# Patient Record
Sex: Female | Born: 2000 | Race: White | Hispanic: No | Marital: Single | Smoking: Never smoker
Health system: Southern US, Community
[De-identification: ages and names within clinical notes are randomized; demographics above are authoritative.]

---
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
## 2019-01-10 IMAGING — CT CT ABD-PELV W/ CM
2 of 4 series · 17 of 46 positions shown, 19 images · IV contrast (Omni 300)
Comparison: None.

CLINICAL DATA: 16-year-old female with right-sided abdominal pain.
Concern for appendicitis.

EXAM:
CT ABDOMEN AND PELVIS WITH CONTRAST
TECHNIQUE: Multidetector CT imaging of the abdomen and pelvis was performed
using the standard protocol following bolus administration of
intravenous contrast.
CONTRAST:  100mL OMNIPAQUE IOHEXOL 300 MG/ML  SOLN

[Series 3: a/p w/ 5mm · axial · 0.81mm/px · z∈[+677,+1057]mm · 14 of 84 slices shown, 16 images]
[im 4/84  soft-tissue]
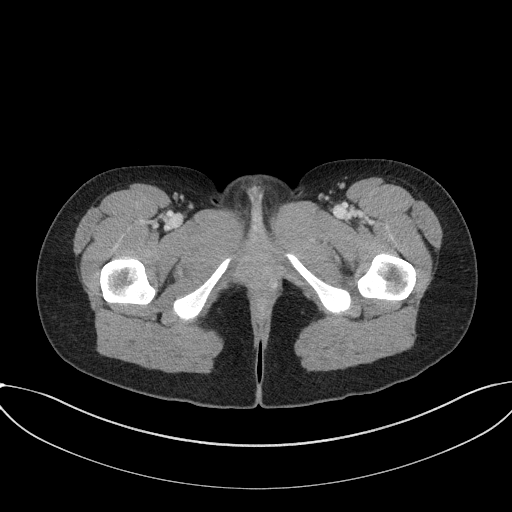
[im 4/84  bone]
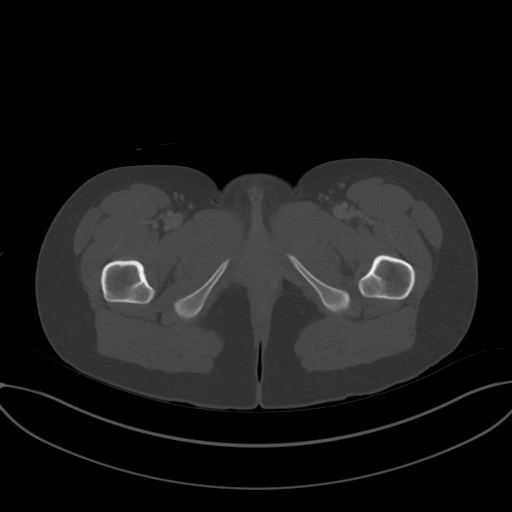
[im 11/84  soft-tissue]
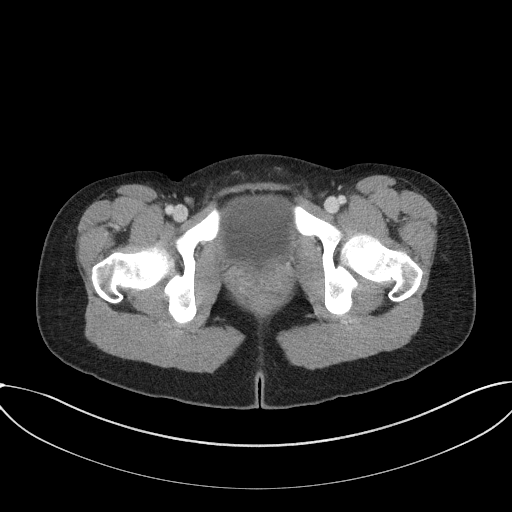
[im 18/84  soft-tissue]
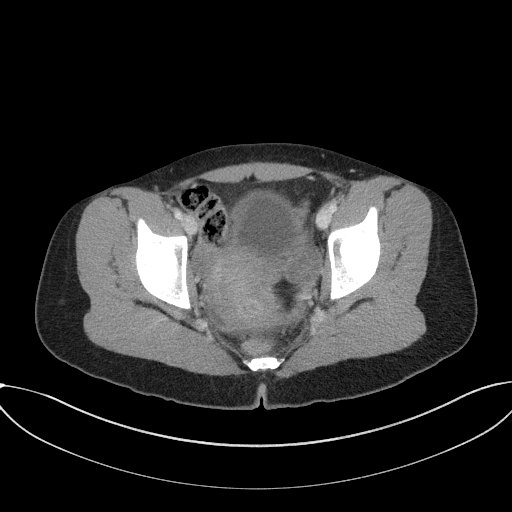
[im 21/84  soft-tissue]
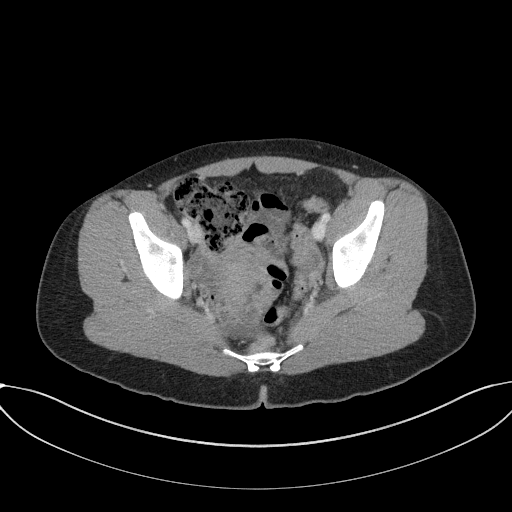
[im 28/84  soft-tissue]
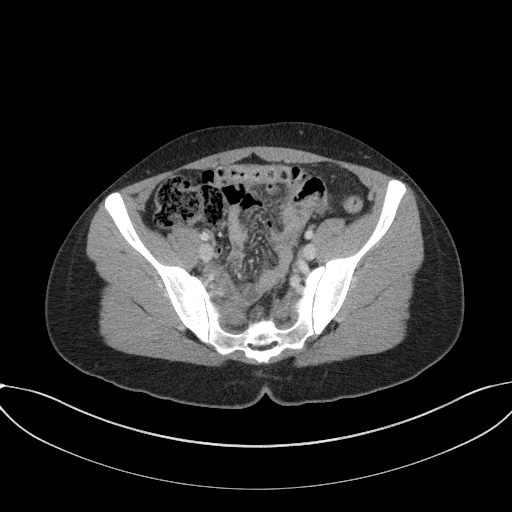
[im 35/84  soft-tissue]
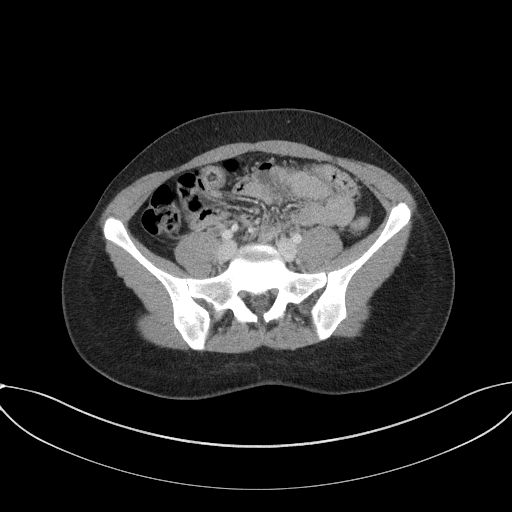
[im 39/84  soft-tissue]
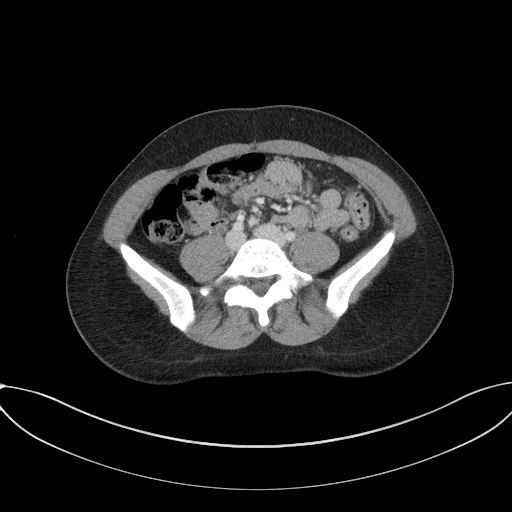
[im 45/84  soft-tissue]
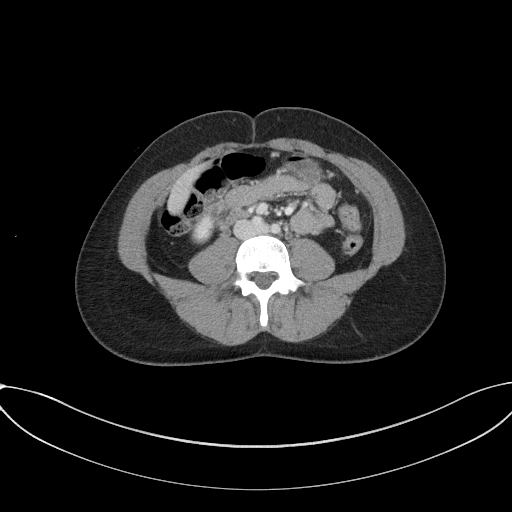
[im 49/84  soft-tissue]
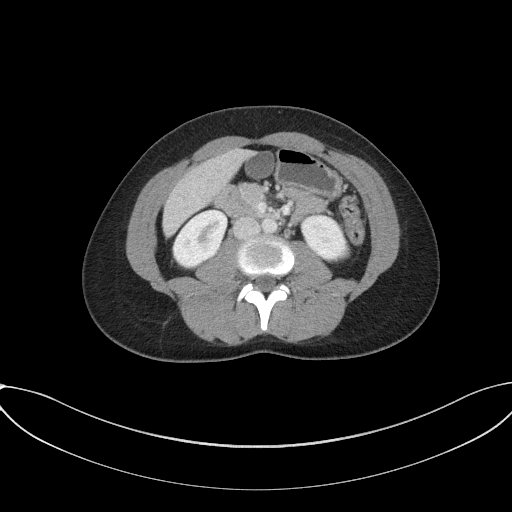
[im 49/84  bone]
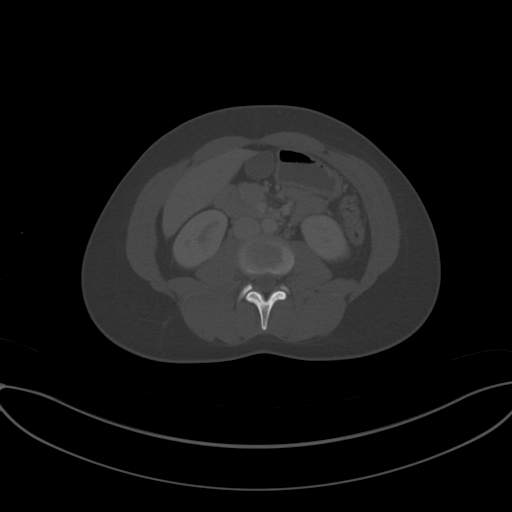
[im 56/84  soft-tissue]
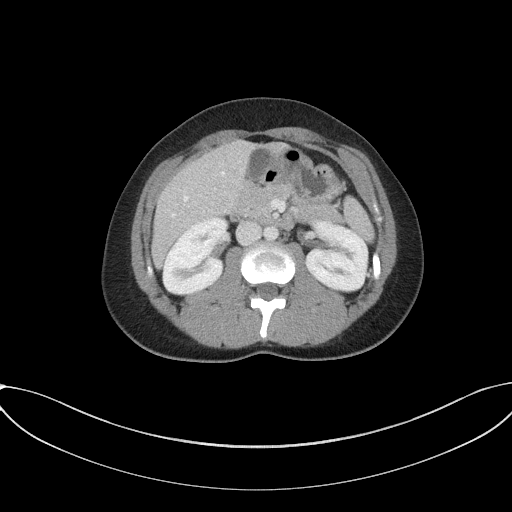
[im 63/84  soft-tissue]
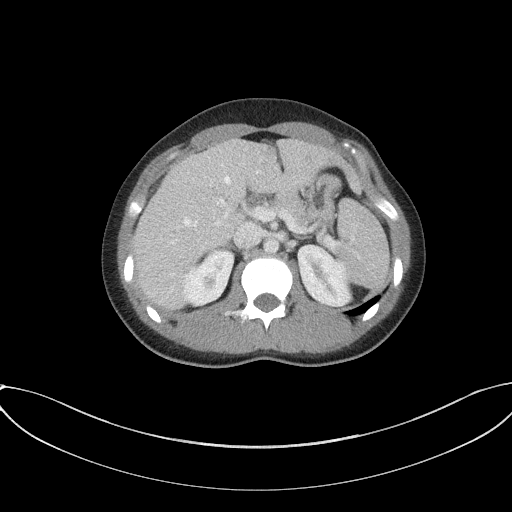
[im 66/84  soft-tissue]
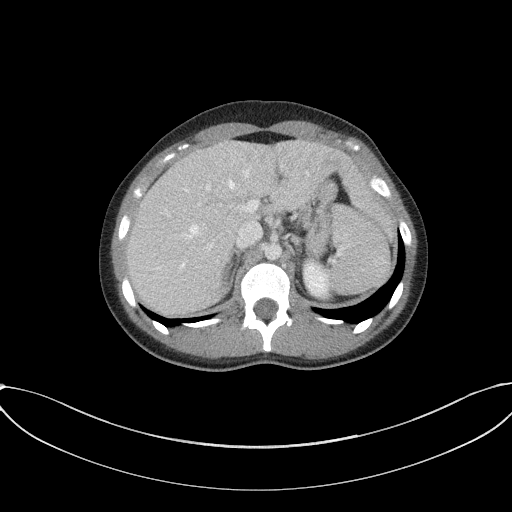
[im 73/84  soft-tissue]
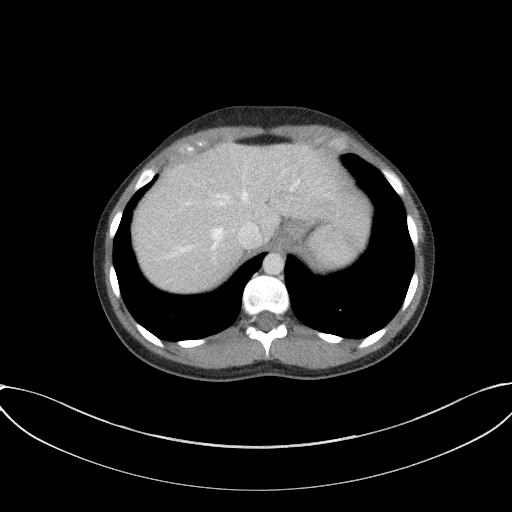
[im 80/84  soft-tissue]
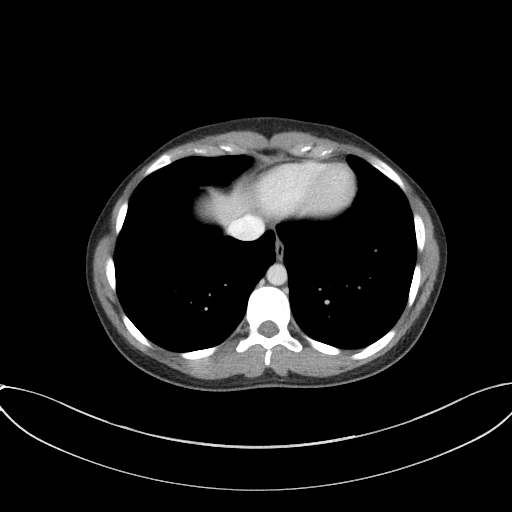

[Series 6: a/p w/ cor · coronal · 0.77mm/px · 3 of 120 slices shown]
[im 40/120  soft-tissue]
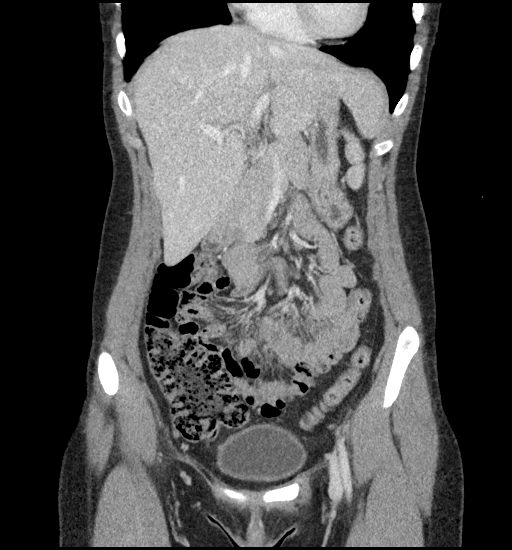
[im 53/120  soft-tissue]
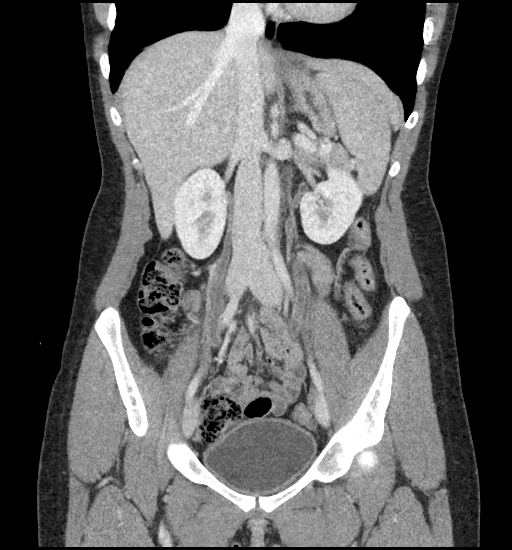
[im 67/120  soft-tissue]
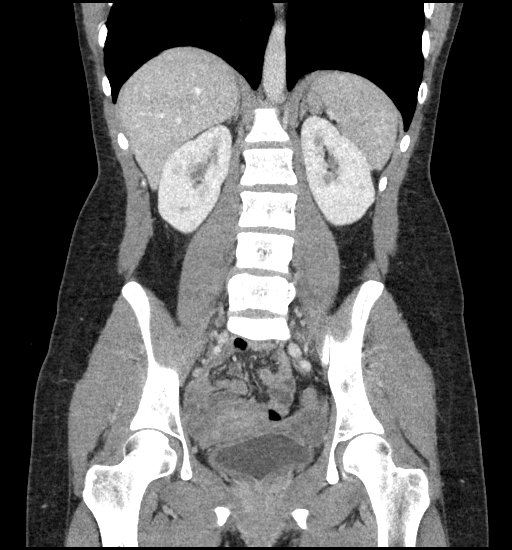

[17 of 46 positions shown; findings below may reference images not displayed]

FINDINGS: Lower chest: The visualized lung bases are clear.

No intra-abdominal free air. There is a small free fluid within the
pelvis.

Hepatobiliary: No focal liver abnormality is seen. No gallstones,
gallbladder wall thickening, or biliary dilatation.

Pancreas: Unremarkable. No pancreatic ductal dilatation or
surrounding inflammatory changes.

Spleen: Normal in size without focal abnormality.

Adrenals/Urinary Tract: The adrenal glands are unremarkable. There
is minimal fullness of the renal collecting system with urothelial
enhancement more pronounced on the right concerning for
pyelonephritis. Correlation with urinalysis recommended. No frank
hydronephrosis. The urinary bladder is grossly unremarkable.

Stomach/Bowel: There is no bowel obstruction or active inflammation.
Normal appendix.

Vascular/Lymphatic: No significant vascular findings are present. No
enlarged abdominal or pelvic lymph nodes.

Reproductive: The uterus is anteverted and appears unremarkable. The
ovaries are unremarkable as well.

Other: None

Musculoskeletal: No acute or significant osseous findings.
IMPRESSION: 1. Findings most concerning for pyelonephritis, predominantly
involving the right kidney. Correlation with urinalysis recommended.
No abscess.
2. No bowel obstruction or active inflammation.  Normal appendix.
